# Patient Record
Sex: Female | Born: 1995 | Race: Black or African American | Hispanic: No | Marital: Single | Smoking: Never smoker
Health system: Southern US, Community
[De-identification: ages and names within clinical notes are randomized; demographics above are authoritative.]

## PROBLEM LIST (undated history)

## (undated) DIAGNOSIS — G43909 Migraine, unspecified, not intractable, without status migrainosus: Secondary | ICD-10-CM

---

## 2017-06-13 ENCOUNTER — Emergency Department (HOSPITAL_COMMUNITY)
Admission: EM | Admit: 2017-06-13 | Discharge: 2017-06-14 | Disposition: A | Discharge status: Home / Self Care | Payer: Medicaid Other | Attending: Emergency Medicine | Admitting: Emergency Medicine

## 2017-06-13 ENCOUNTER — Encounter (HOSPITAL_COMMUNITY): Payer: Self-pay | Admitting: *Deleted

## 2017-06-13 ENCOUNTER — Other Ambulatory Visit: Payer: Self-pay

## 2017-06-13 ENCOUNTER — Emergency Department (HOSPITAL_COMMUNITY): Payer: Medicaid Other

## 2017-06-13 DIAGNOSIS — J9859 Other diseases of mediastinum, not elsewhere classified: Secondary | ICD-10-CM | POA: Insufficient documentation

## 2017-06-13 DIAGNOSIS — S060X0A Concussion without loss of consciousness, initial encounter: Secondary | ICD-10-CM | POA: Diagnosis not present

## 2017-06-13 DIAGNOSIS — Y9389 Activity, other specified: Secondary | ICD-10-CM | POA: Insufficient documentation

## 2017-06-13 DIAGNOSIS — Y929 Unspecified place or not applicable: Secondary | ICD-10-CM | POA: Insufficient documentation

## 2017-06-13 DIAGNOSIS — Z79899 Other long term (current) drug therapy: Secondary | ICD-10-CM | POA: Diagnosis not present

## 2017-06-13 DIAGNOSIS — Y998 Other external cause status: Secondary | ICD-10-CM | POA: Insufficient documentation

## 2017-06-13 DIAGNOSIS — S0990XA Unspecified injury of head, initial encounter: Secondary | ICD-10-CM | POA: Diagnosis present

## 2017-06-13 DIAGNOSIS — W228XXA Striking against or struck by other objects, initial encounter: Secondary | ICD-10-CM | POA: Insufficient documentation

## 2017-06-13 HISTORY — DX: Migraine, unspecified, not intractable, without status migrainosus: G43.909

## 2017-06-13 LAB — CBC WITH DIFFERENTIAL/PLATELET
Basophils Absolute: 0 10*3/uL (ref 0.0–0.1)
Basophils Relative: 0 %
Eosinophils Absolute: 0.1 10*3/uL (ref 0.0–0.7)
Eosinophils Relative: 1 %
HEMATOCRIT: 38.9 % (ref 36.0–46.0)
HEMOGLOBIN: 12.6 g/dL (ref 12.0–15.0)
LYMPHS ABS: 2.7 10*3/uL (ref 0.7–4.0)
Lymphocytes Relative: 28 %
MCH: 30.1 pg (ref 26.0–34.0)
MCHC: 32.4 g/dL (ref 30.0–36.0)
MCV: 92.8 fL (ref 78.0–100.0)
MONO ABS: 0.6 10*3/uL (ref 0.1–1.0)
MONOS PCT: 6 %
NEUTROS ABS: 6.2 10*3/uL (ref 1.7–7.7)
NEUTROS PCT: 65 %
Platelets: 189 10*3/uL (ref 150–400)
RBC: 4.19 MIL/uL (ref 3.87–5.11)
RDW: 12.8 % (ref 11.5–15.5)
WBC: 9.6 10*3/uL (ref 4.0–10.5)

## 2017-06-13 LAB — I-STAT BETA HCG BLOOD, ED (MC, WL, AP ONLY): I-stat hCG, quantitative: 7.2 m[IU]/mL — ABNORMAL HIGH

## 2017-06-13 LAB — BASIC METABOLIC PANEL WITH GFR
Anion gap: 5 (ref 5–15)
BUN: 8 mg/dL (ref 6–20)
CO2: 26 mmol/L (ref 22–32)
Calcium: 9.3 mg/dL (ref 8.9–10.3)
Chloride: 106 mmol/L (ref 101–111)
Creatinine, Ser: 0.68 mg/dL (ref 0.44–1.00)
GFR calc Af Amer: >60 mL/min
GFR calc non Af Amer: >60 mL/min
Glucose, Bld: 115 mg/dL — ABNORMAL HIGH (ref 65–99)
Potassium: 3.4 mmol/L — ABNORMAL LOW (ref 3.5–5.1)
Sodium: 137 mmol/L (ref 135–145)

## 2017-06-13 LAB — HCG, QUANTITATIVE, PREGNANCY: hCG, Beta Chain, Quant, S: 2 m[IU]/mL (ref <5)

## 2017-06-13 MED ORDER — IOPAMIDOL (ISOVUE-300) INJECTION 61%
INTRAVENOUS | Status: AC
  Administered 2017-06-14: 75 mL
  Filled 2017-06-13: qty 75

## 2017-06-13 NOTE — ED Notes (Signed)
Main lab to add on HCG

## 2017-06-13 NOTE — ED Provider Notes (Signed)
[facility] Provider Note   CSN: [id number] Arrival date & time: 06/13/17  1529     History   Chief Complaint Chief Complaint  Patient presents with  . Head Injury  . Aphasia    HPI Kayla Young is a 21 y.o. female.  The history is provided by the patient and a parent.  She accidentally banged her head on a car door.  There was no loss of consciousness, but she states that she was stunned.  Initially, she was unable to speak and unable to move her legs.  She was brought to the emergency department.  Over time, while waiting for me to see her, she has gradually regained normal speech and she is now able to move her legs.  Family states that she seems like she is back to normal, but she has not tried to ambulate.  There is no nausea or vomiting.  There is no numbness or tingling.  She is complaining of mild pain in her forehead where he she hit the car door.  Past Medical History:  Diagnosis Date  . Migraines     There are no active problems to display for this patient.   History reviewed. No pertinent surgical history.  OB History    No data available       Home Medications    Prior to Admission medications   Medication Sig Start Date End Date Taking? Authorizing Provider  aspirin-acetaminophen-caffeine (EXCEDRIN MIGRAINE) [id number] MG tablet Take 1-2 tablets every 6 (six) hours as needed by mouth for headache or migraine.    Yes [provider]  Multiple Vitamins-Calcium (ONE-A-DAY WOMENS FORMULA) TABS Take 1 tablet daily by mouth.   Yes [provider]  naproxen sodium (ALEVE) 220 MG tablet Take 220-440 mg every 6 (six) hours as needed by mouth (for cramps).   Yes [provider]  Probiotic CAPS Take 1 capsule daily by mouth.   Yes [provider]  SUMAtriptan (IMITREX) 25 MG tablet Take 25 mg once as needed by mouth for migraine. May repeat in 2 hours if headache persists or recurs.    Yes  [provider]    Family History No family history on file.  Social History Social History   Tobacco Use  . Smoking status: Not on file  Substance Use Topics  . Alcohol use: Not on file  . Drug use: Not on file     Allergies   Lactose and Pollen extract   Review of Systems Review of Systems  All other systems reviewed and are negative.    Physical Exam Updated Vital Signs BP 105/72   Pulse 77   Resp 17   Ht 5\' 2"  (1.575 m)   Wt 41.7 kg (92 lb)   SpO2 100%   BMI 16.83 kg/m   Physical Exam  Nursing note and vitals reviewed.  21 year old female, resting comfortably and in no acute distress. Vital signs are normal. Oxygen saturation is 100%, which is normal. Head is normocephalic.  Minor ecchymosis present on the forehead. PERRLA, EOMI. Oropharynx is clear. Neck is nontender and supple without adenopathy or JVD. Back is nontender and there is no CVA tenderness. Lungs are clear without rales, wheezes, or rhonchi. Chest is nontender. Heart has regular rate and rhythm without murmur. Abdomen is soft, flat, nontender without masses or hepatosplenomegaly and peristalsis is normoactive. Extremities have no cyanosis or edema, full range of motion is present. Skin is warm and dry  without rash. Neurologic: Mental status is normal, cranial nerves are intact, there are no motor or sensory deficits.  ED Treatments / Results  Labs (all labs ordered are listed, but only abnormal results are displayed) Labs Reviewed  BASIC METABOLIC PANEL - Abnormal; Notable for the following components:      Result Value   Potassium 3.4 (*)    Glucose, Bld 115 (*)    All other components within normal limits  I-STAT BETA HCG BLOOD, ED ([facility], [facility], [facility] ONLY) - Abnormal; Notable for the following components:   I-stat hCG, quantitative 7.2 (*)    All other components within normal limits  CBC WITH DIFFERENTIAL/PLATELET  HCG, QUANTITATIVE, PREGNANCY  I-STAT BETA HCG BLOOD, ED ([facility],  [facility], [facility] ONLY)   Radiology Ct Head Wo Contrast  Result Date: 06/13/2017 CLINICAL DATA:  CT head/c-spine without contrast due to head trauma, headache Pt was running from a bee and tried to get into car, smacking her head on the car frame. She is now having difficulty talking and is writing that her legs won't respond. Left occipital pain. Unable to move legs. EXAM: CT HEAD WITHOUT CONTRAST CT CERVICAL SPINE WITHOUT CONTRAST TECHNIQUE: Multidetector CT imaging of the head and cervical spine was performed following the standard protocol without intravenous contrast. Multiplanar CT image reconstructions of the cervical spine were also generated. COMPARISON:  None. FINDINGS: CT HEAD FINDINGS Brain: No evidence of acute infarction, hemorrhage, hydrocephalus, extra-axial collection or mass lesion/mass effect. Vascular: No hyperdense vessel or unexpected calcification. Skull: Normal. Negative for fracture or focal lesion. Sinuses/Orbits: No acute finding. Other: None CT CERVICAL SPINE FINDINGS Alignment: There is loss of cervical lordosis. This may be secondary to splinting, soft tissue injury, or positioning. Otherwise alignment is normal. Skull base and vertebrae: No acute fracture. No primary bone lesion or focal pathologic process. Soft tissues and spinal canal: The spinal canal is unremarkable in appearance. Disc levels:  Unremarkable. Upper chest: The lung apices are unremarkable in appearance. The visualized portion of the upper chest shows a possible anterior mediastinal mass. This is only partially imaged and is mixed density, measuring at least 3.7 x 4.1 cm. Appearance is atypical for normal thymic tissue and further evaluation should be considered. IMPRESSION: 1.  No evidence for acute intracranial abnormality. 2. No evidence for acute cervical spine fracture. Loss of cervical lordosis. 3. Question of anterior mediastinal mass seen only on the lower most cuts of the cervical spine series. Recommend further  evaluation CT of the chest. Intravenous contrast is recommended unless contraindicated. Electronically Signed   By: Norva Pavlov M.D.   On: 06/13/2017 16:37   Ct Chest W Contrast  Result Date: 06/14/2017 CLINICAL DATA:  Workup of mediastinal mass. Possible anterior mediastinal mass seen on CT cervical spine from 06/13/2017. EXAM: CT CHEST WITH CONTRAST TECHNIQUE: Multidetector CT imaging of the chest was performed during intravenous contrast administration. CONTRAST:  75mL ISOVUE-300 IOPAMIDOL (ISOVUE-300) INJECTION 61% COMPARISON:  CT cervical spine 06/13/2017 FINDINGS: Cardiovascular: No significant vascular findings. Normal heart size. No pericardial effusion. Mediastinum/Nodes: Increased density in the anterior mediastinum likely represents residual thymic tissue. No discrete mass lesion is identified. Residual thymic tissue likely accounts for the changes seen on previous neck CT. Mild prominence of anterior mediastinal fat. No significant lymphadenopathy in the chest. Esophagus is decompressed. Lungs/Pleura: Lungs are clear and expanded. No pleural effusions. No pneumothorax. Airways are patent. Upper Abdomen: No acute process demonstrated in the visualized upper abdomen. Musculoskeletal: No chest wall abnormality. No acute or  significant osseous findings. IMPRESSION: Increased density in the anterior mediastinum likely representing benign thymic tissue. No significant mass or lymphadenopathy. Lungs are clear. Electronically Signed   By: Burman NievesWilliam  Stevens M.D.   On: 06/14/2017 01:07   Ct Cervical Spine Wo Contrast  Result Date: 06/13/2017 CLINICAL DATA:  CT head/c-spine without contrast due to head trauma, headache Pt was running from a bee and tried to get into car, smacking her head on the car frame. She is now having difficulty talking and is writing that her legs won't respond. Left occipital pain. Unable to move legs. EXAM: CT HEAD WITHOUT CONTRAST CT CERVICAL SPINE WITHOUT CONTRAST TECHNIQUE:  Multidetector CT imaging of the head and cervical spine was performed following the standard protocol without intravenous contrast. Multiplanar CT image reconstructions of the cervical spine were also generated. COMPARISON:  None. FINDINGS: CT HEAD FINDINGS Brain: No evidence of acute infarction, hemorrhage, hydrocephalus, extra-axial collection or mass lesion/mass effect. Vascular: No hyperdense vessel or unexpected calcification. Skull: Normal. Negative for fracture or focal lesion. Sinuses/Orbits: No acute finding. Other: None CT CERVICAL SPINE FINDINGS Alignment: There is loss of cervical lordosis. This may be secondary to splinting, soft tissue injury, or positioning. Otherwise alignment is normal. Skull base and vertebrae: No acute fracture. No primary bone lesion or focal pathologic process. Soft tissues and spinal canal: The spinal canal is unremarkable in appearance. Disc levels:  Unremarkable. Upper chest: The lung apices are unremarkable in appearance. The visualized portion of the upper chest shows a possible anterior mediastinal mass. This is only partially imaged and is mixed density, measuring at least 3.7 x 4.1 cm. Appearance is atypical for normal thymic tissue and further evaluation should be considered. IMPRESSION: 1.  No evidence for acute intracranial abnormality. 2. No evidence for acute cervical spine fracture. Loss of cervical lordosis. 3. Question of anterior mediastinal mass seen only on the lower most cuts of the cervical spine series. Recommend further evaluation CT of the chest. Intravenous contrast is recommended unless contraindicated. Electronically Signed   By: Norva PavlovElizabeth  Brown M.D.   On: 06/13/2017 16:37    Procedures Procedures (including critical care time)  Medications Ordered in ED Medications  iopamidol (ISOVUE-300) 61 % injection (75 mLs  Contrast Given 06/14/17 0033)     Initial Impression / Assessment and Plan / ED Course  I have reviewed the triage vital signs  and the nursing notes.  Pertinent labs & imaging results that were available during my care of the patient were reviewed by me and considered in my medical decision making (see chart for details).  Closed head injury with initial episode of a aphasia and inability to use legs.  This most likely was psychogenic, since it did not follow any normal neurologic pattern.  She seems to be back to baseline.  CT of head had been obtained and was negative.  CT of cervical spine showed possible anterior mediastinal mass with recommendation for chest CT to further characterize.  Of note, point-of-care hCG is come back minimally elevated.  I suspect this is a laboratory error and will check main lab hCG.  She will be sent for CT of chest to evaluate mediastinal mass.  Old records are reviewed, and she has no relevant past visits.  CT chest shows thymus tissue, no tumor.  Final Clinical Impressions(s) / ED Diagnoses   Final diagnoses:  Concussion without loss of consciousness, initial encounter    ED Discharge Orders    None       Preston FleetingGlick,  Onalee Hua, MD 06/14/17 2303

## 2017-06-13 NOTE — ED Notes (Signed)
Dr Glendell Dockerooke stated no need for C-collar at this time.

## 2017-06-13 NOTE — ED Triage Notes (Signed)
Pt was running from a bee and tried to get into car, smacking her head on the car frame.  She is now having difficulty talking and is writing that her legs won't respond.   Denies neck pain, back pain, but states L occipital pain.   Able to move arms, but unable to move legs.  Family states no psyche history.  Dr Glendell Dockerooke assessing pt.

## 2017-06-14 ENCOUNTER — Emergency Department (HOSPITAL_COMMUNITY): Payer: Medicaid Other

## 2017-06-14 NOTE — ED Notes (Signed)
Pt ambulated in the hallway with a steady gait.

## 2017-10-05 ENCOUNTER — Other Ambulatory Visit: Payer: Self-pay

## 2017-10-05 ENCOUNTER — Encounter (HOSPITAL_COMMUNITY): Payer: Self-pay

## 2017-10-05 ENCOUNTER — Emergency Department (HOSPITAL_COMMUNITY)
Admission: EM | Admit: 2017-10-05 | Discharge: 2017-10-06 | Disposition: A | Discharge status: Home / Self Care | Payer: Medicaid Other | Attending: Emergency Medicine | Admitting: Emergency Medicine

## 2017-10-05 DIAGNOSIS — F322 Major depressive disorder, single episode, severe without psychotic features: Secondary | ICD-10-CM

## 2017-10-05 DIAGNOSIS — F329 Major depressive disorder, single episode, unspecified: Secondary | ICD-10-CM | POA: Diagnosis present

## 2017-10-05 DIAGNOSIS — R45851 Suicidal ideations: Secondary | ICD-10-CM | POA: Diagnosis not present

## 2017-10-05 DIAGNOSIS — F332 Major depressive disorder, recurrent severe without psychotic features: Secondary | ICD-10-CM | POA: Insufficient documentation

## 2017-10-05 LAB — ACETAMINOPHEN LEVEL: Acetaminophen (Tylenol), Serum: <10 ug/mL — ABNORMAL LOW (ref 10–30)

## 2017-10-05 LAB — COMPREHENSIVE METABOLIC PANEL
ALBUMIN: 4.2 g/dL (ref 3.5–5.0)
ALK PHOS: 47 U/L (ref 38–126)
ALT: 16 U/L (ref 14–54)
AST: 19 U/L (ref 15–41)
Anion gap: 10 (ref 5–15)
BILIRUBIN TOTAL: 0.7 mg/dL (ref 0.3–1.2)
BUN: 11 mg/dL (ref 6–20)
CALCIUM: 9.9 mg/dL (ref 8.9–10.3)
CO2: 22 mmol/L (ref 22–32)
CREATININE: 0.71 mg/dL (ref 0.44–1.00)
Chloride: 105 mmol/L (ref 101–111)
GFR calc Af Amer: >60 mL/min (ref >60)
GLUCOSE: 81 mg/dL (ref 65–99)
Potassium: 4 mmol/L (ref 3.5–5.1)
Sodium: 137 mmol/L (ref 135–145)
TOTAL PROTEIN: 7.2 g/dL (ref 6.5–8.1)

## 2017-10-05 LAB — CBC
HEMATOCRIT: 39.1 % (ref 36.0–46.0)
Hemoglobin: 13 g/dL (ref 12.0–15.0)
MCH: 31 pg (ref 26.0–34.0)
MCHC: 33.2 g/dL (ref 30.0–36.0)
MCV: 93.3 fL (ref 78.0–100.0)
PLATELETS: 188 10*3/uL (ref 150–400)
RBC: 4.19 MIL/uL (ref 3.87–5.11)
RDW: 13.2 % (ref 11.5–15.5)
WBC: 9.5 10*3/uL (ref 4.0–10.5)

## 2017-10-05 LAB — I-STAT BETA HCG BLOOD, ED (MC, WL, AP ONLY): I-stat hCG, quantitative: 6.9 m[IU]/mL — ABNORMAL HIGH (ref <5)

## 2017-10-05 LAB — SALICYLATE LEVEL: Salicylate Lvl: <7 mg/dL (ref 2.8–30.0)

## 2017-10-05 LAB — ETHANOL

## 2017-10-05 LAB — RAPID URINE DRUG SCREEN, HOSP PERFORMED
AMPHETAMINES: NOT DETECTED
BENZODIAZEPINES: NOT DETECTED
Barbiturates: NOT DETECTED
Cocaine: NOT DETECTED
OPIATES: NOT DETECTED
Tetrahydrocannabinol: NOT DETECTED

## 2017-10-05 NOTE — ED Notes (Signed)
Staffing called for sitter.   

## 2017-10-05 NOTE — ED Triage Notes (Signed)
Pt states that she is depressed and having thoughts of harming herself, her plan is " not existing" states she has thoughts of harming others in her dreams, denies AVH.

## 2017-10-06 DIAGNOSIS — R450 Nervousness: Secondary | ICD-10-CM | POA: Diagnosis not present

## 2017-10-06 DIAGNOSIS — F419 Anxiety disorder, unspecified: Secondary | ICD-10-CM | POA: Diagnosis not present

## 2017-10-06 DIAGNOSIS — R45851 Suicidal ideations: Secondary | ICD-10-CM | POA: Diagnosis not present

## 2017-10-06 DIAGNOSIS — F332 Major depressive disorder, recurrent severe without psychotic features: Secondary | ICD-10-CM | POA: Diagnosis not present

## 2017-10-06 LAB — PREGNANCY, URINE: Preg Test, Ur: NEGATIVE

## 2017-10-06 MED ORDER — ACETAMINOPHEN 325 MG PO TABS: 650.0000 mg | ORAL_TABLET | ORAL | Status: DC | PRN

## 2017-10-06 MED ORDER — ONDANSETRON HCL 4 MG PO TABS: 4.0000 mg | ORAL_TABLET | Freq: Three times a day (TID) | ORAL | Status: DC | PRN

## 2017-10-06 NOTE — ED Notes (Signed)
Was given a snack and drink.

## 2017-10-06 NOTE — Progress Notes (Signed)
Per Leighton Ruffina Okonkwo NP, pt is psychiatrically cleared for discharge. CSW notified Kriste BasqueBecky RN and faxed resources for pt to [phone].  Trula SladeHeather Smart, MSW, LCSW Clinical Social Worker 10/06/2017 9:55 AM

## 2017-10-06 NOTE — Discharge Instructions (Signed)
Follow-up with the resources given. 

## 2017-10-06 NOTE — ED Notes (Signed)
Having TTS done at this time.

## 2017-10-06 NOTE — ED Notes (Signed)
Pt's grandmother, Karmen BongoMarge Upshur, visiting w/pt. States she is pt's legal guardian and will bring paperwork when returns at 1230 visitation time. Pt signed consent to release info to Marge - [facility], copy sent to Medical Records, and original placed on clipboard. Pt states she no longer feels SI and is wanting to go home. Grandmother states she feels safe w/pt coming home - states she didn't last night but feels it will be OK today. Ad[facility].

## 2017-10-06 NOTE — ED Notes (Addendum)
Pt asking when her Legal Guardian - grandmother - will be visiting. States she lives w/her. States she wants to run away from her grandmothers and return as a man. States she wants to be a man but her grandmother does not agree and tells her she should not have these thoughts/feelings. Medical Clearance Pt Policy form discussed and given to pt - Pt voiced understanding.

## 2017-10-06 NOTE — ED Notes (Signed)
Grandmother clarified she is not pt's legal guardian at this time - pt is her own guardian. States she had "custody" of her and her sister - rather than guardianship. States pt is [facility] [employer] - "college program" - trying to get into "STEM program". States she became upset yesterday when was advised she does not qualify for this program at this time.

## 2017-10-06 NOTE — ED Notes (Signed)
Ordered Breakfast Tray  

## 2017-10-06 NOTE — ED Notes (Signed)
Pt's grandmother has arrived to pick up pt - voiced understanding and agreement w/tx plan. Pt changing into personal clothing.

## 2017-10-06 NOTE — ED Provider Notes (Signed)
[facility]   CSN: [id number] Arrival date & time: 10/05/17  2109     History   Chief Complaint Chief Complaint  Patient presents with  . Suicidal    HPI Kayla Young is a 22 y.o. female.  22 year old female with a history of migraines presents to the emergency department for psychiatric evaluation.  Patient with worsening depression and thoughts of not existing.  She does express suicidal ideations, but denies any suicidal plan.  Patient seems very paranoid.  She references the feeling of not wanting to "do anything" because then there won't be repercussions. She speaks of anxiety over being part of the "system" and the need to answer to "government". She felt like running away today, but states that her grandmother would die if she did this; patient lives with her grandmother. She has tried to run away once before, but only made it through half her neighborhood. No HI, AVH. No current psychiatric medications.      Past Medical History:  Diagnosis Date  . Migraines     There are no active problems to display for this patient.   History reviewed. No pertinent surgical history.  OB History    No data available       Home Medications    Prior to Admission medications   Medication Sig Start Date End Date Taking? Authorizing Provider  aspirin-acetaminophen-caffeine (EXCEDRIN MIGRAINE) [id number] MG tablet Take 1-2 tablets every 6 (six) hours as needed by mouth for headache or migraine.     [provider]  Multiple Vitamins-Calcium (ONE-A-DAY WOMENS FORMULA) TABS Take 1 tablet daily by mouth.    [provider]  naproxen sodium (ALEVE) 220 MG tablet Take 220-440 mg every 6 (six) hours as needed by mouth (for cramps).    [provider]  Probiotic CAPS Take 1 capsule daily by mouth.    [provider]  SUMAtriptan (IMITREX) 25 MG tablet Take 25 mg once as needed by mouth for migraine.  May repeat in 2 hours if headache persists or recurs.     [provider]    Family History No family history on file.  Social History Social History   Tobacco Use  . Smoking status: Never Smoker  . Smokeless tobacco: Never Used  Substance Use Topics  . Alcohol use: No    Frequency: Never  . Drug use: No     Allergies   Lactose and Pollen extract   Review of Systems Review of Systems Ten systems reviewed and are negative for acute change, except as noted in the HPI.    Physical Exam Updated Vital Signs BP 103/67 (BP Location: Right Arm)   Pulse 63   Temp 98.5 F (36.9 C) (Oral)   Resp 16   Ht 5\' 6"  (1.676 m)   Wt 40.8 kg (90 lb)   LMP 10/03/2017   SpO2 100%   BMI 14.53 kg/m   Physical Exam  Constitutional: She is oriented to person, place, and time. She appears well-developed. No distress.  Nontoxic appearing  HENT:  Head: Normocephalic and atraumatic.  Eyes: Conjunctivae and EOM are normal. No scleral icterus.  Neck: Normal range of motion.  Cardiovascular: Normal rate, regular rhythm and intact distal pulses.  Pulmonary/Chest: Effort normal. No stridor. No respiratory distress.  Respirations even and unlabored  Musculoskeletal: Normal range of motion.  Neurological: She is alert and oriented to person, place, and time. She exhibits normal muscle tone. Coordination normal.  Skin:  Skin is warm and dry. No rash noted. She is not diaphoretic. No erythema. No pallor.  Psychiatric: Her mood appears anxious. She is withdrawn. She exhibits a depressed mood. She expresses suicidal ideation. She expresses no homicidal ideation. She expresses no suicidal plans.  Timid, anxious  Nursing note and vitals reviewed.    ED Treatments / Results  Labs (all labs ordered are listed, but only abnormal results are displayed) Labs Reviewed  ACETAMINOPHEN LEVEL - Abnormal; Notable for the following components:      Result Value   Acetaminophen (Tylenol), Serum <10  (*)    All other components within normal limits  I-STAT BETA HCG BLOOD, ED ([facility], [facility], [facility] ONLY) - Abnormal; Notable for the following components:   I-stat hCG, quantitative 6.9 (*)    All other components within normal limits  COMPREHENSIVE METABOLIC PANEL  ETHANOL  SALICYLATE LEVEL  CBC  RAPID URINE DRUG SCREEN, HOSP PERFORMED  PREGNANCY, URINE    EKG  EKG Interpretation None       Radiology No results found.  Procedures Procedures (including critical care time)  Medications Ordered in ED Medications  acetaminophen (TYLENOL) tablet 650 mg (not administered)  ondansetron (ZOFRAN) tablet 4 mg (not administered)     Initial Impression / Assessment and Plan / ED Course  I have reviewed the triage vital signs and the nursing notes.  Pertinent labs & imaging results that were available during my care of the patient were reviewed by me and considered in my medical decision making (see chart for details).     Patient presenting voluntarily for psychiatric evaluation.  She has been medically cleared and recommended for inpatient management.  Placement pending at this time.  Disposition to be determined by oncoming ED provider.   Final Clinical Impressions(s) / ED Diagnoses   Final diagnoses:  Current severe episode of major depressive disorder without psychotic features without prior episode [facility])    ED Discharge Orders    None       Antony Madura, PA-C 10/06/17 Derinda Late, MD 10/06/17 [phone]

## 2017-10-06 NOTE — Progress Notes (Addendum)
Patient meets criteria for inpatient treatment. [facility]. CSW manually faxed referrals to the following facilities for review:  [facility], [facility], [facility], [facility], [facility], [facility], [facility], [facility], [facility], [facility], [facility], [facility], [facility], [facility]  TTS will continue to seek bed placement.   Trula SladeHeather Smart, MSW, LCSW Clinical Social Worker 10/06/2017 8:53 AM

## 2017-10-06 NOTE — Consult Note (Signed)
Telepsych Consultation   Reason for Consult: Depression Referring Physician: PA Antonietta Breach Location of Patient: [facility] ED Location of Provider: [facility]  Patient Identification: Kayla Young MRN:  [id number] Principal Diagnosis: <principal problem not specified> Diagnosis:  There are no active problems to display for this patient.   Total Time spent with patient: 30 minutes  Subjective:   Kayla Young is a 22 y.o. female patient admitted with Major depressive disorder, Single episode, Severe.  HPI: Per the TTS assessment completed on 10/06/17 by Virgina Organ: Kayla Young is an 22 y.o. female. The pt came in after expressing SI with a plan to runaway from home and hope something bad happens to her, such as being shot by someone.  She feels that she is a burden to her family and things would be better if she was not around.  She reported her primary stressor is going to [employer] [facility] [employer] [facility] [employer].  She stated she had a traumatic experience at [facility] [facility] [employer] [facility] [employer] [facility] [employer] and the traumatic experience was her teacher lying to her.  When asked for specifics about what her teacher lied about, the pt could not give detail about what the teacher lied about.  She has been going to [employer] for the past 2 years.  She stated she is stressed, because people ask her what she wants to do with her life and she doesn't know what she wants to do with her life.  The pt lives with her grandmother and her sister, who she calls her best and only friend.  The pt's grand mother is her legal guardian.  The pt stated her sister doesn't talk to her like she used to.  The pt denied any past inpatient or outpatient treatment.  She denies any previous suicide attempts.  She reported she has tried to run away in the past, but her grandmother catches her.  She reports she doesn't sleep well nor does she eat well.  She complained of feeling bad about herself.   On Exam: Patient was seen via tele-psych, chart reviewed with treatment team. Patient in  bed, awake, alert and oriented x4. Patient reiterated the reason for this hospital admission as documented above. Patient stated, "I came to the hospital because of depression, negative emotion and lack of motivation". Patient stated that she feels better today she feels fine. Patient denies any suicide or homicide ideations as well as auditory hallucinations. Patient reports hearing voices sometimes, she states that the voices that sounds like her mother's voices. Patient stated that she has been worried about having to repeat things over and that she wouldn't want to repeat anything. Patient stated that she has had a tough life, she has been through the foster care and she feels that she can't be able to accomplish anything. Patient stated that she wants to leave because she wants to enjoy life. Patient agrees to follow up with outpatient services for further psychiatric evaluations and possibly getting on medications for mood control. Per the nurse's noted this morning at 8:56 AM by Eber Hong, patient's grandmother feels that patient is safe to come home. She does not have any safety concerns. Patient's does not appear to be responding to internal stimuli during this encounter. Patient contracts for safety and agrees to follow of as recommended.  Past Psychiatric History: As in H&P  Risk to Self: Suicidal Ideation: Yes-Currently Present Suicidal Intent: Yes-Currently Present Is patient at risk for suicide?: Yes Suicidal Plan?: Yes-Currently Present Specify Current Suicidal Plan: runaway from home and hope something bad happens,  such as get shot by someone Access to Means: Yes Specify Access to Suicidal Means: she can run away What has been your use of drugs/alcohol within the last 12 months?: none How many times?: 0 Other Self Harm Risks: none Triggers for Past Attempts: Unpredictable Intentional Self Injurious Behavior: None Risk to Others: Homicidal Ideation: No Thoughts of Harm to Others:  No Current Homicidal Intent: No Current Homicidal Plan: No Access to Homicidal Means: No Identified Victim: NA History of harm to others?: No Assessment of Violence: None Noted Violent Behavior Description: none Does patient have access to weapons?: No Criminal Charges Pending?: No Does patient have a court date: No Prior Inpatient Therapy: Prior Inpatient Therapy: No Prior Therapy Dates: NA Prior Therapy Facilty/Provider(s): NA Reason for Treatment: NA Prior Outpatient Therapy: Prior Outpatient Therapy: No Prior Therapy Dates: NA Prior Therapy Facilty/Provider(s): NA Reason for Treatment: NA Does patient have an ACCT team?: No Does patient have Intensive In-House Services?  : No Does patient have [facility] services? : No Does patient have P4CC services?: No  Past Medical History:  Past Medical History:  Diagnosis Date  . Migraines    History reviewed. No pertinent surgical history. Family History: No family history on file. Family Psychiatric  History: Unknown Social History:  Social History   Substance and Sexual Activity  Alcohol Use No  . Frequency: Never     Social History   Substance and Sexual Activity  Drug Use No    Social History   Socioeconomic History  . Marital status: Single    Spouse name: None  . Number of children: None  . Years of education: None  . Highest education level: None  Social Needs  . Financial resource strain: None  . Food insecurity - worry: None  . Food insecurity - inability: None  . Transportation needs - medical: None  . Transportation needs - non-medical: None  Occupational History  . None  Tobacco Use  . Smoking status: Never Smoker  . Smokeless tobacco: Never Used  Substance and Sexual Activity  . Alcohol use: No    Frequency: Never  . Drug use: No  . Sexual activity: None  Other Topics Concern  . None  Social History Narrative  . None   Additional Social History:    Allergies:   Allergies  Allergen  Reactions  . Lactose Diarrhea and Other (See Comments)    Also causes stomach issues and sinus congestion  . Pollen Extract Itching and Other (See Comments)    Headaches also    Labs:  Results for orders placed or performed during the hospital encounter of 10/05/17 (from the past 48 hour(s))  Comprehensive metabolic panel     Status: None   Collection Time: 10/05/17  9:20 PM  Result Value Ref Range   Sodium 137 135 - 145 mmol/L   Potassium 4.0 3.5 - 5.1 mmol/L   Chloride 105 101 - 111 mmol/L   CO2 22 22 - 32 mmol/L   Glucose, Bld 81 65 - 99 mg/dL   BUN 11 6 - 20 mg/dL   Creatinine, Ser 0.71 0.44 - 1.00 mg/dL   Calcium 9.9 8.9 - 10.3 mg/dL   Total Protein 7.2 6.5 - 8.1 g/dL   Albumin 4.2 3.5 - 5.0 g/dL   AST 19 15 - 41 U/L   ALT 16 14 - 54 U/L   Alkaline Phosphatase 47 38 - 126 U/L   Total Bilirubin 0.7 0.3 - 1.2 mg/dL   GFR calc non  Af Amer >60 >60 mL/min   GFR calc Af Amer >60 >60 mL/min    Comment: (NOTE) The eGFR has been calculated using the CKD EPI equation. This calculation has not been validated in all clinical situations. eGFR's persistently <60 mL/min signify possible Chronic Kidney Disease.    Anion gap 10 5 - 15    Comment: Performed at [facility] [address] [place], [place] [address]  Ethanol     Status: None   Collection Time: 10/05/17  9:20 PM  Result Value Ref Range   Alcohol, Ethyl (B) <10 <10 mg/dL    Comment:        LOWEST DETECTABLE LIMIT FOR SERUM ALCOHOL IS 10 mg/dL FOR MEDICAL PURPOSES ONLY Performed at [facility], [place] [address] [place], [place] [address]   Salicylate level     Status: None   Collection Time: 10/05/17  9:20 PM  Result Value Ref Range   Salicylate Lvl <2.2 2.8 - 30.0 mg/dL    Comment: Performed at [facility] [address] [place], [place] [address]  Acetaminophen level     Status: Abnormal   Collection Time: 10/05/17  9:20 PM  Result Value Ref Range   Acetaminophen (Tylenol), Serum <10 (L)  10 - 30 ug/mL    Comment:        THERAPEUTIC CONCENTRATIONS VARY SIGNIFICANTLY. A RANGE OF 10-30 ug/mL MAY BE AN EFFECTIVE CONCENTRATION FOR MANY PATIENTS. HOWEVER, SOME ARE BEST TREATED AT CONCENTRATIONS OUTSIDE THIS RANGE. ACETAMINOPHEN CONCENTRATIONS >150 ug/mL AT 4 HOURS AFTER INGESTION AND >50 ug/mL AT 12 HOURS AFTER INGESTION ARE OFTEN ASSOCIATED WITH TOXIC REACTIONS. Performed at [facility], [place] [address], [place], [place] [address]   cbc     Status: None   Collection Time: 10/05/17  9:20 PM  Result Value Ref Range   WBC 9.5 4.0 - 10.5 K/uL   RBC 4.19 3.87 - 5.11 MIL/uL   Hemoglobin 13.0 12.0 - 15.0 g/dL   HCT 39.1 36.0 - 46.0 %   MCV 93.3 78.0 - 100.0 fL   MCH 31.0 26.0 - 34.0 pg   MCHC 33.2 30.0 - 36.0 g/dL   RDW 13.2 11.5 - 15.5 %   Platelets 188 150 - 400 K/uL    Comment: Performed at [facility] [address] [place], [place] [address]  Rapid urine drug screen (hospital performed)     Status: None   Collection Time: 10/05/17  9:30 PM  Result Value Ref Range   Opiates NONE DETECTED NONE DETECTED   Cocaine NONE DETECTED NONE DETECTED   Benzodiazepines NONE DETECTED NONE DETECTED   Amphetamines NONE DETECTED NONE DETECTED   Tetrahydrocannabinol NONE DETECTED NONE DETECTED   Barbiturates NONE DETECTED NONE DETECTED    Comment: (NOTE) DRUG SCREEN FOR MEDICAL PURPOSES ONLY.  IF CONFIRMATION IS NEEDED FOR ANY PURPOSE, NOTIFY LAB WITHIN 5 DAYS. LOWEST DETECTABLE LIMITS FOR URINE DRUG SCREEN Drug Class                     Cutoff (ng/mL) Amphetamine and metabolites    1000 Barbiturate and metabolites    200 Benzodiazepine                 151 Tricyclics and metabolites     300 Opiates and metabolites        300 Cocaine and metabolites        300 THC  50 Performed at [facility], [place] [address] [place], [place] [address]   Pregnancy, urine     Status: None   Collection Time: 10/05/17  9:30 PM  Result Value  Ref Range   Preg Test, Ur NEGATIVE NEGATIVE    Comment:        THE SENSITIVITY OF THIS METHODOLOGY IS >20 mIU/mL. Performed at [facility], [place] [address] [place], [place] [address]   I-Stat beta hCG blood, ED     Status: Abnormal   Collection Time: 10/05/17  9:31 PM  Result Value Ref Range   I-stat hCG, quantitative 6.9 (H) <5 mIU/mL   Comment 3            Comment:   GEST. AGE      CONC.  (mIU/mL)   <=1 WEEK        5 - 50     2 WEEKS       50 - 500     3 WEEKS       100 - 10,000     4 WEEKS     1,000 - 30,000        FEMALE AND NON-PREGNANT FEMALE:     LESS THAN 5 mIU/mL     Medications:  Current Facility-Administered Medications  Medication Dose Route Frequency Provider Last Rate Last Dose  . acetaminophen (TYLENOL) tablet 650 mg  650 mg Oral Q4H PRN Antonietta Breach, PA-C      . ondansetron (ZOFRAN) tablet 4 mg  4 mg Oral Q8H PRN Antonietta Breach, PA-C       Current Outpatient Medications  Medication Sig Dispense Refill  . aspirin-acetaminophen-caffeine (EXCEDRIN MIGRAINE) 250-250-65 MG tablet Take 1-2 tablets every 6 (six) hours as needed by mouth for headache or migraine.     . Multiple Vitamins-Calcium (ONE-A-DAY WOMENS FORMULA) TABS Take 1 tablet daily by mouth.    . naproxen sodium (ALEVE) 220 MG tablet Take 220-440 mg every 6 (six) hours as needed by mouth (for cramps).    . Probiotic CAPS Take 1 capsule daily by mouth.    . SUMAtriptan (IMITREX) 25 MG tablet Take 25 mg once as needed by mouth for migraine. May repeat in 2 hours if headache persists or recurs.       Musculoskeletal: UTA via camera  Psychiatric Specialty Exam: Physical Exam  Nursing note and vitals reviewed.   Review of Systems  Psychiatric/Behavioral: Positive for depression. Negative for hallucinations, substance abuse and suicidal ideas. The patient is nervous/anxious. The patient does not have insomnia.   All other systems reviewed and are negative.   Blood pressure 94/60, pulse 73,  temperature 98.2 F (36.8 C), temperature source Oral, resp. rate 16, height 5' 6" (1.676 m), weight 40.8 kg (90 lb), last menstrual period 10/03/2017, SpO2 100 %.Body mass index is 14.53 kg/m.  General Appearance: on hospital scrub  Eye Contact:  Fair  Speech:  Clear and Coherent and Normal Rate  Volume:  Normal  Mood:  Anxious and Depressed  Affect:  Depressed  Thought Process:  Coherent and Goal Directed  Orientation:  Full (Time, Place, and Person)  Thought Content:  WDL and Logical  Suicidal Thoughts:  No  Homicidal Thoughts:  No  Memory:  Immediate;   Good Recent;   Good Remote;   Fair  Judgement:  Intact  Insight:  Good and Present  Psychomotor Activity:  Normal  Concentration:  Concentration: Good and Attention Span: Good  Recall:  Good  Fund of Knowledge:  Good  Language:  Good  Akathisia:  Negative  Handed:  Right  AIMS (if indicated):     Assets:  Communication Skills Desire for Improvement Financial Resources/Insurance Housing Leisure Time Physical Health Social Support  ADL's:  Intact  Cognition:  WNL  Sleep:       Treatment Plan recommendations as discussed and agreed with Dr. Dwyane Dee:  Treatment Plan Summary: Plan to discharge patient with OP resources for counselling  Follow up with [facility] for therapy and medication management Follow up with Social Work consult for Care coordination Take all medications as prescribed Avoid the use of alcohol and/or drugs Stay well hydrated Activity as tolerated Follow up with PCP for any new or existing medical concerns  Disposition: No evidence of imminent risk to self or others at present.   Patient does not meet criteria for psychiatric inpatient admission. Supportive therapy provided about ongoing stressors. Refer to IOP. Discussed crisis plan, support from social network, calling 911, coming to the Emergency Department, and calling Suicide Hotline.  This service was  provided via telemedicine using a 2-way, interactive audio and video technology.  Names of all persons participating in this telemedicine service and their role in this encounter. Name: Kieli Golladay Role: Patient  Name: Justina A. Okonkwo  Role: NP           Vicenta Aly, NP 10/06/2017 9:45 AM

## 2017-10-06 NOTE — ED Notes (Signed)
Pt aware NP, [facility], has recommended for her to be d/c'd. Pt voiced understanding and agreement w/tx plan - advised she may contact her grandmother from phone at nurses' desk.

## 2017-10-06 NOTE — ED Notes (Signed)
It was reported that pt's grandmother took home two bags of belongings.  Pt very soft spoken, RN attempted to make her comfortable, reports she does not need anything.  PA will be in shortly.

## 2017-10-06 NOTE — ED Notes (Signed)
TTS began 

## 2017-10-06 NOTE — BH Assessment (Addendum)
Tele Assessment Note   Patient Name: Kayla Young MRN: [id number] Referring Physician: PA Antony MaduraKelly Humes Location of Patient: [place] Location of Provider: Behavioral Health TTS Department  Kayla Young is an 22 y.o. female. The pt came in after expressing SI with a plan to runaway from home and hope something bad happens to her, such as being shot by someone.  She feels that she is a burden to her family and things would be better if she was not around.  She reported her primary stressor is [facility] [employer].  She stated she had a [facility] [employer] [employer] and the traumatic experience was her teacher lying to her.  When asked for specifics about what her teacher lied about, the pt could not give detail about what the teacher lied about.  She has been going [facility] [employer] for the past 2 years.  She stated she is stressed, because people ask her what she wants to do with her life and she doesn't know what she wants to do with her life.  The pt lives with her grandmother and her sister, who she calls her best and only friend.  The pt's grand mother is her legal guardian.  The pt stated her sister doesn't talk to her like she used to.  The pt denied any past inpatient or outpatient treatment.  She denies any previous suicide attempts.  She reported she has tried to run away in the past, but her grandmother catches her.  She reports she doesn't sleep well nor does she eat well.  She complained of feeling bad about herself.  The pt denies HI, SA and psychosis.  Diagnosis: F32.2 Major depressive disorder, Single episode, Severe   Past Medical History:  Past Medical History:  Diagnosis Date  . Migraines     History reviewed. No pertinent surgical history.  Family History: No family history on file.  Social History:  reports that  has never smoked. she has never used smokeless tobacco. She reports that she does not drink alcohol or use drugs.  Additional Social History:  Alcohol / Drug Use Pain  Medications: See MAR Prescriptions: See MAR Over the Counter: See MAR History of alcohol / drug use?: No history of alcohol / drug abuse Longest period of sobriety (when/how long): NA  CIWA: CIWA-Ar BP: 103/67 Pulse Rate: 63 COWS:    Allergies:  Allergies  Allergen Reactions  . Lactose Diarrhea and Other (See Comments)    Also causes stomach issues and sinus congestion  . Pollen Extract Itching and Other (See Comments)    Headaches also    Home Medications:  (Not in a hospital admission)  OB/GYN Status:  Patient's last menstrual period was 10/03/2017.  General Assessment Data [facility] TTS Assessment: In system Is this a Tele or Face-to-Face Assessment?: Tele Assessment Is this an Initial Assessment or a Re-assessment for this encounter?: Initial Assessment Marital status: Single Maiden name: Earlene PlaterDavis Is patient pregnant?: No Pregnancy Status: No Living Arrangements: Other relatives(grand parent) Can pt return to current living arrangement?: Yes Admission Status: Voluntary Is patient capable of signing voluntary admission?: No(has a guardian) Referral Source: Self/Family/Friend Insurance type: Medicaid     Crisis Care Plan Living Arrangements: Other relatives(grand parent) Legal Guardian: Maternal Grandmother Name of Psychiatrist: none Name of Therapist: none  Education Status Is patient currently in school?: Yes Current Grade: college Highest grade of school patient has completed: high school Name of school: [employer] Contact person: NA  Risk to self  with the past 6 months Suicidal Ideation: Yes-Currently Present Has patient been a risk to self within the past 6 months prior to admission? : No Suicidal Intent: Yes-Currently Present Has patient had any suicidal intent within the past 6 months prior to admission? : Yes Is patient at risk for suicide?: Yes Suicidal Plan?: Yes-Currently Present Has patient had any suicidal plan within the past 6  months prior to admission? : Yes Specify Current Suicidal Plan: runaway from home and hope something bad happens, such as get shot by someone Access to Means: Yes Specify Access to Suicidal Means: she can run away What has been your use of drugs/alcohol within the last 12 months?: none Previous Attempts/Gestures: No How many times?: 0 Other Self Harm Risks: none Triggers for Past Attempts: Unpredictable Intentional Self Injurious Behavior: None Family Suicide History: Unknown Recent stressful life event(s): Other (Comment)(says college is stressing her) Persecutory voices/beliefs?: No Depression: Yes Depression Symptoms: Loss of interest in usual pleasures, Feeling worthless/self pity, Isolating Substance abuse history and/or treatment for substance abuse?: No Suicide prevention information given to non-admitted patients: Not applicable  Risk to Others within the past 6 months Homicidal Ideation: No Does patient have any lifetime risk of violence toward others beyond the six months prior to admission? : No Thoughts of Harm to Others: No Current Homicidal Intent: No Current Homicidal Plan: No Access to Homicidal Means: No Identified Victim: NA History of harm to others?: No Assessment of Violence: None Noted Violent Behavior Description: none Does patient have access to weapons?: No Criminal Charges Pending?: No Does patient have a court date: No Is patient on probation?: No  Psychosis Hallucinations: None noted Delusions: None noted  Mental Status Report Appearance/Hygiene: In hospital gown, Unremarkable Eye Contact: Fair Motor Activity: Unable to assess Speech: Soft, Logical/coherent Level of Consciousness: Alert Mood: Depressed Affect: Depressed Anxiety Level: None Thought Processes: Coherent, Relevant Judgement: Impaired Orientation: Person, Place, Time, Situation, Appropriate for developmental age Obsessive Compulsive Thoughts/Behaviors: None  Cognitive  Functioning Concentration: Normal Memory: Recent Intact, Remote Intact IQ: Average Insight: Poor Impulse Control: Poor Appetite: Poor Weight Loss: 0 Weight Gain: 0 Sleep: No Change Total Hours of Sleep: 8 Vegetative Symptoms: None  ADLScreening [facility] Assessment Services) Patient's cognitive ability adequate to safely complete daily activities?: Yes Patient able to express need for assistance with ADLs?: Yes Independently performs ADLs?: Yes (appropriate for developmental age)  Prior Inpatient Therapy Prior Inpatient Therapy: No Prior Therapy Dates: NA Prior Therapy Facilty/Provider(s): NA Reason for Treatment: NA  Prior Outpatient Therapy Prior Outpatient Therapy: No Prior Therapy Dates: NA Prior Therapy Facilty/Provider(s): NA Reason for Treatment: NA Does patient have an ACCT team?: No Does patient have Intensive In-House Services?  : No Does patient have [facility] services? : No Does patient have [facility] services?: No  ADL Screening (condition at time of admission) Patient's cognitive ability adequate to safely complete daily activities?: Yes Patient able to express need for assistance with ADLs?: Yes Independently performs ADLs?: Yes (appropriate for developmental age)       Abuse/Neglect Assessment (Assessment to be complete while patient is alone) Abuse/Neglect Assessment Can Be Completed: Yes Physical Abuse: Denies Verbal Abuse: Denies Sexual Abuse: Denies Exploitation of patient/patient's resources: Denies Self-Neglect: Denies Values / Beliefs Cultural Requests During Hospitalization: None Spiritual Requests During Hospitalization: None Consults Spiritual Care Consult Needed: No Social Work Consult Needed: No      Additional Information 1:1 In Past 12 Months?: No CIRT Risk: No Elopement Risk: No Does patient have medical clearance?: Yes  Disposition:  Disposition Initial Assessment Completed for this Encounter: Yes Disposition of Patient:  Inpatient treatment program Type of inpatient treatment program: Adult   The pt is recommended for inpatient treatment.  Victorino Dike RN was made aware of the recommendation. This service was provided via telemedicine using a 2-way, interactive audio and video technology.  Names of all persons participating in this telemedicine service and their role in this encounter. Name: Timothea Bodenheimer Role: Pt  Name: Riley Churches Role: TTS  Name:  Role:   Name:  Role:     Ottis Stain 10/06/2017 4:42 AM

## 2017-10-06 NOTE — ED Notes (Signed)
Called and requested a sitter

## 2017-10-06 NOTE — ED Notes (Signed)
Outpt resources discussed and given - pt voices understanding and agreement w/tx plan. Pt waiting on her grandmother to pick her up.

## 2019-05-17 IMAGING — CT CT CHEST W/ CM
2 of 4 series · 15 of 36 positions shown, 18 images · IV contrast (APPLIED)
Comparison: CT cervical spine 06/13/2017

CLINICAL DATA: Workup of mediastinal mass. Possible anterior
mediastinal mass seen on CT cervical spine from 06/13/2017.

EXAM:
CT CHEST WITH CONTRAST
TECHNIQUE: Multidetector CT imaging of the chest was performed during
intravenous contrast administration.
CONTRAST:  75mL ASUBD9-C55 IOPAMIDOL (ASUBD9-C55) INJECTION 61%

[Series 3: chest wo · axial · 0.57mm/px · z∈[-370,-128]mm · 12 of 145 slices shown, 15 images]
[im 12/145  mediastinal]
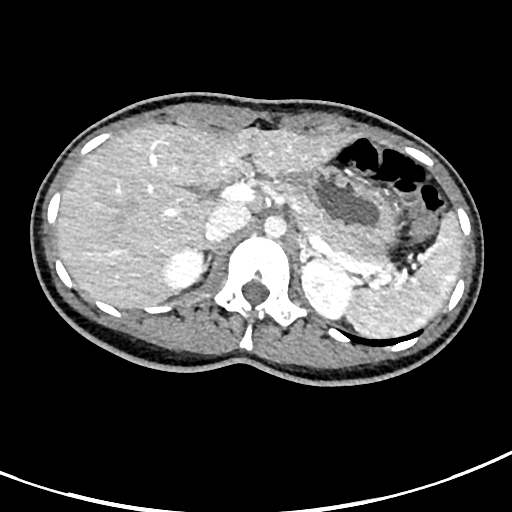
[im 12/145  lung]
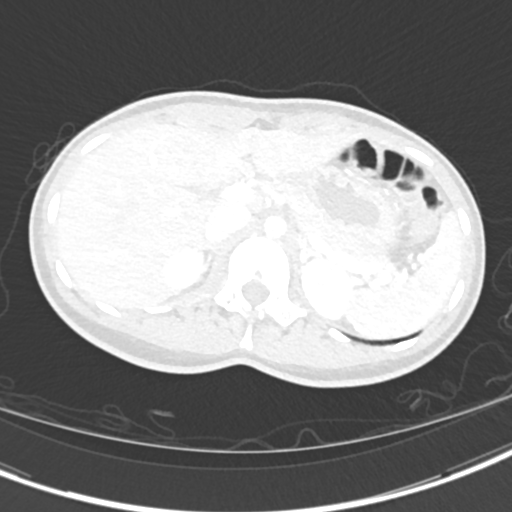
[im 23/145  lung]
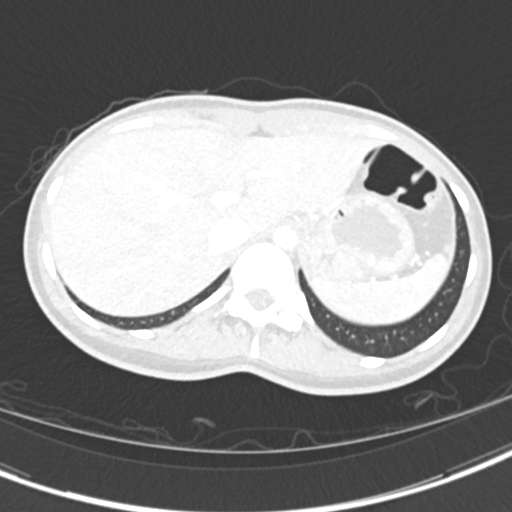
[im 34/145  lung]
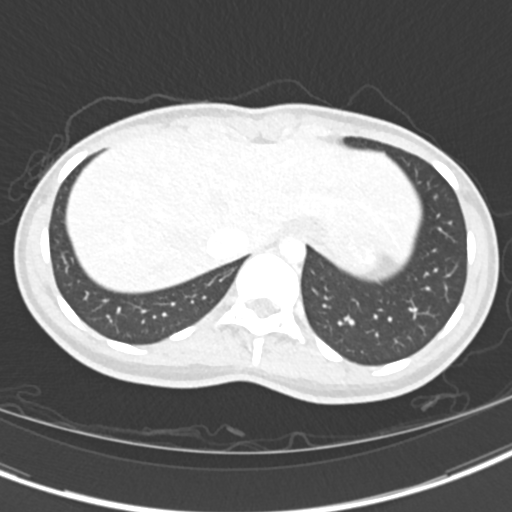
[im 45/145  lung]
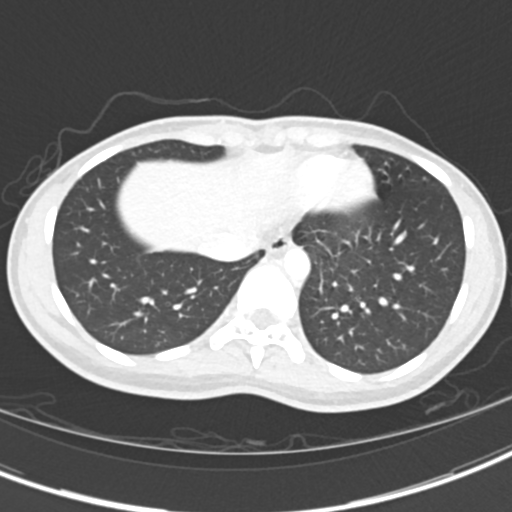
[im 56/145  mediastinal]
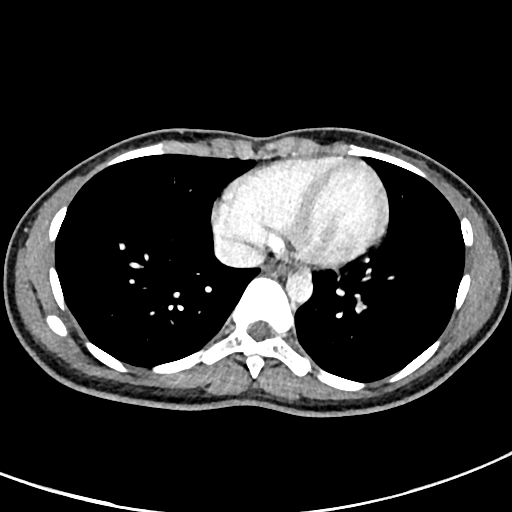
[im 56/145  lung]
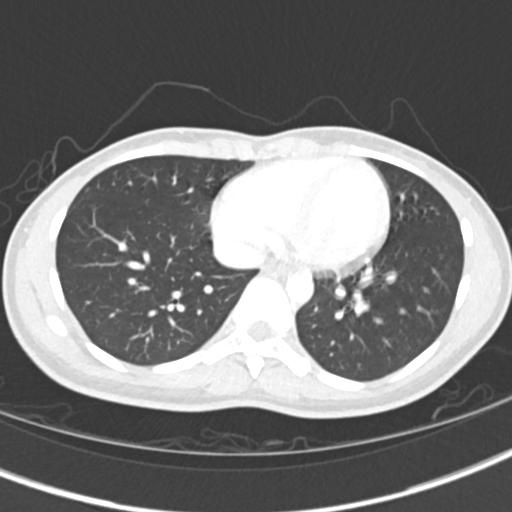
[im 67/145  lung]
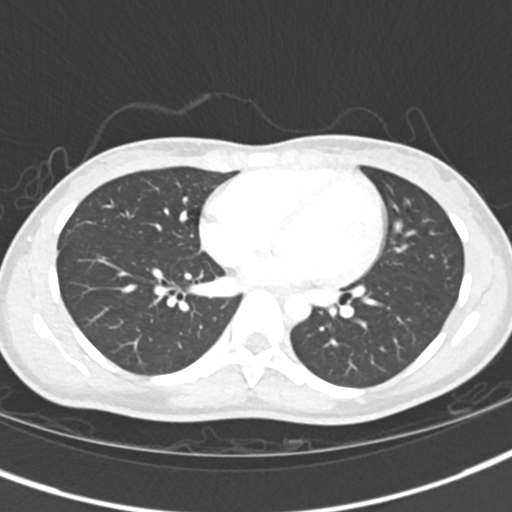
[im 78/145  lung]
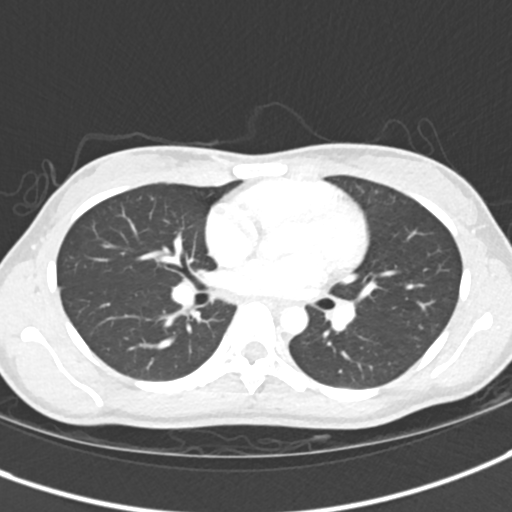
[im 89/145  lung]
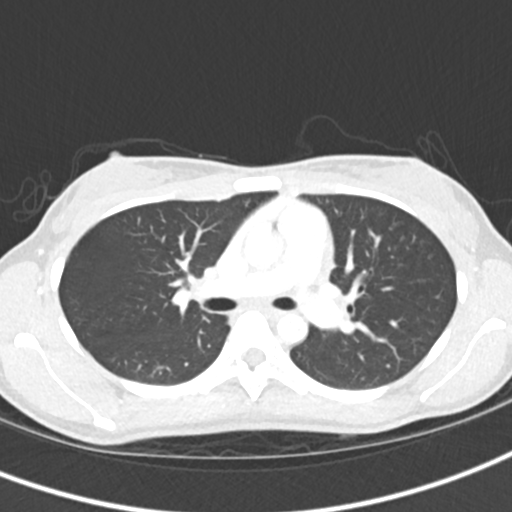
[im 100/145  mediastinal]
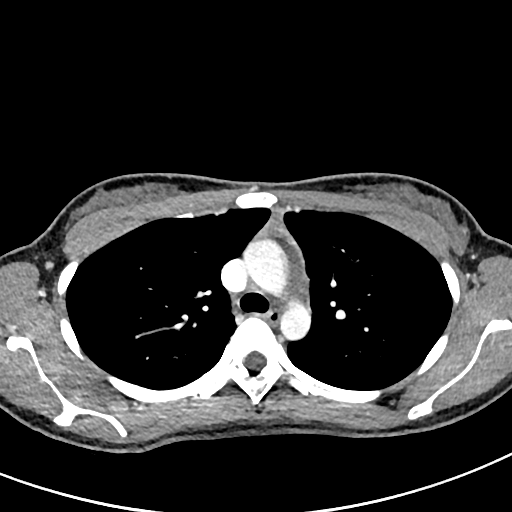
[im 100/145  lung]
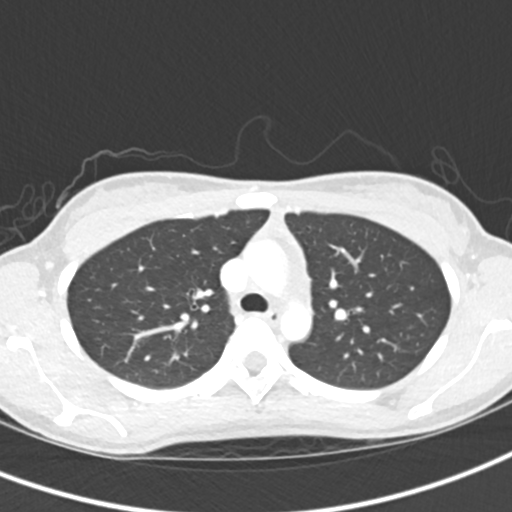
[im 111/145  lung]
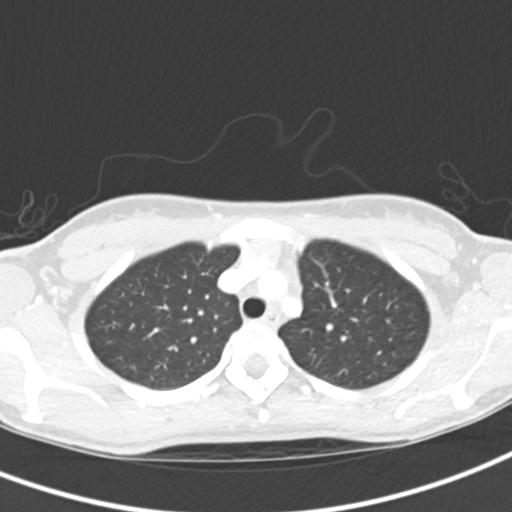
[im 122/145  lung]
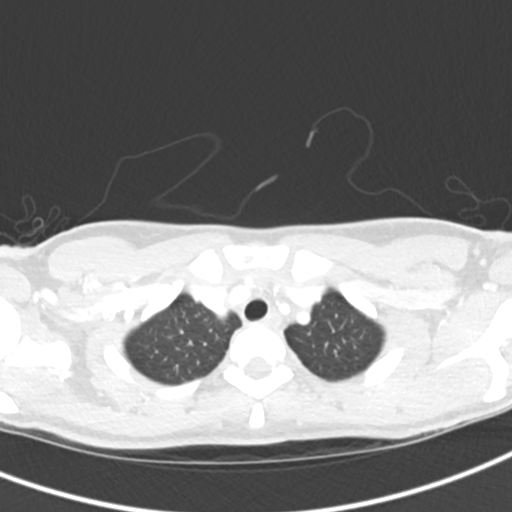
[im 133/145  lung]
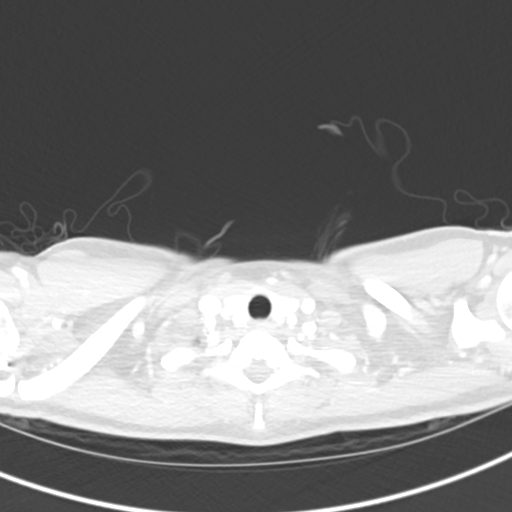

[Series 6: cor · coronal · 0.59mm/px · 3 of 101 slices shown]
[im 21/101  lung]
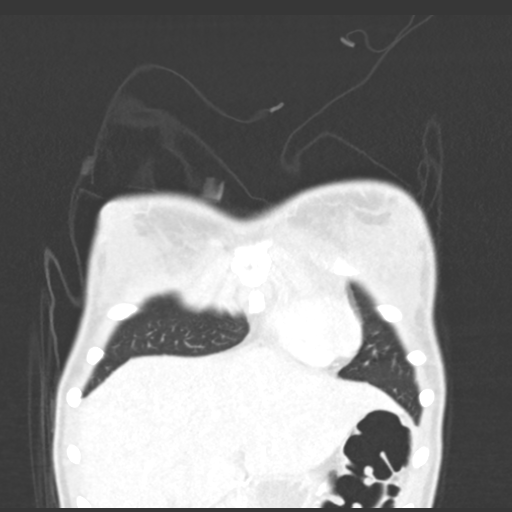
[im 41/101  lung]
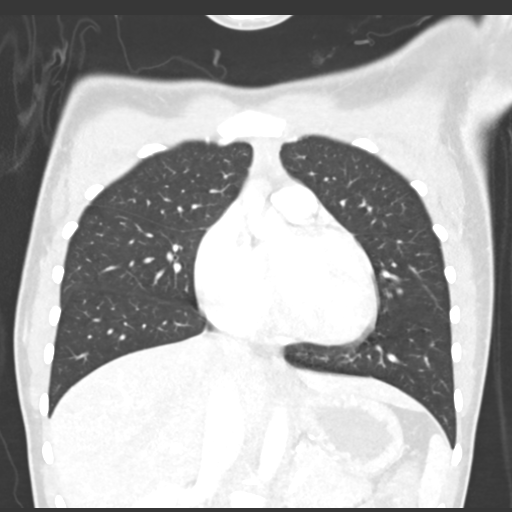
[im 61/101  lung]
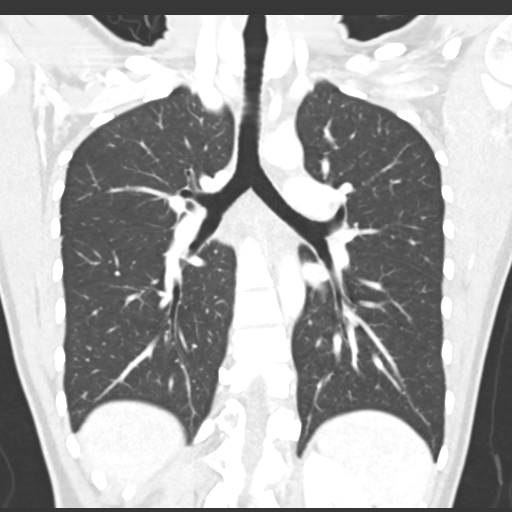

[15 of 36 positions shown; findings below may reference images not displayed]

FINDINGS: Cardiovascular: No significant vascular findings. Normal heart size.
No pericardial effusion.

Mediastinum/Nodes: Increased density in the anterior mediastinum
likely represents residual thymic tissue. No discrete mass lesion is
identified. Residual thymic tissue likely accounts for the changes
seen on previous neck CT. Mild prominence of anterior mediastinal
fat. No significant lymphadenopathy in the chest. Esophagus is
decompressed.

Lungs/Pleura: Lungs are clear and expanded. No pleural effusions. No
pneumothorax. Airways are patent.

Upper Abdomen: No acute process demonstrated in the visualized upper
abdomen.

Musculoskeletal: No chest wall abnormality. No acute or significant
osseous findings.
IMPRESSION: Increased density in the anterior mediastinum likely representing
benign thymic tissue. No significant mass or lymphadenopathy. Lungs
are clear.

## 2020-02-13 ENCOUNTER — Emergency Department (HOSPITAL_COMMUNITY)
Admission: EM | Admit: 2020-02-13 | Discharge: 2020-02-14 | Disposition: A | Discharge status: Home / Self Care | Payer: Medicaid Other | Attending: Emergency Medicine | Admitting: Emergency Medicine

## 2020-02-13 ENCOUNTER — Encounter (HOSPITAL_COMMUNITY): Payer: Self-pay | Admitting: Emergency Medicine

## 2020-02-13 ENCOUNTER — Other Ambulatory Visit: Payer: Self-pay

## 2020-02-13 DIAGNOSIS — Z5321 Procedure and treatment not carried out due to patient leaving prior to being seen by health care provider: Secondary | ICD-10-CM | POA: Insufficient documentation

## 2020-02-13 DIAGNOSIS — H5713 Ocular pain, bilateral: Secondary | ICD-10-CM | POA: Insufficient documentation

## 2020-02-13 NOTE — ED Triage Notes (Signed)
Patient reports bilateral eye pain onset yesterday , denies injury , no vision loss , patient stated that she received " refractory/dilation eye drops " [facility] .

## 2020-02-14 NOTE — ED Notes (Signed)
Patient advised staff that she is leaving .
